# Patient Record
Sex: Male | Born: 1995 | Race: White | Hispanic: No | Marital: Single | Smoking: Current every day smoker
Health system: Southern US, Community
[De-identification: ages and names within clinical notes are randomized; demographics above are authoritative.]

## PROBLEM LIST (undated history)

## (undated) DIAGNOSIS — F909 Attention-deficit hyperactivity disorder, unspecified type: Secondary | ICD-10-CM

## (undated) HISTORY — PX: TONSILLECTOMY: SUR1361

---
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
## 2016-12-30 ENCOUNTER — Encounter: Payer: Self-pay | Admitting: Emergency Medicine

## 2016-12-30 ENCOUNTER — Emergency Department
Admission: EM | Admit: 2016-12-30 | Discharge: 2016-12-30 | Disposition: A | Discharge status: Home / Self Care | Payer: Medicaid Other | Attending: Emergency Medicine | Admitting: Emergency Medicine

## 2016-12-30 DIAGNOSIS — R079 Chest pain, unspecified: Secondary | ICD-10-CM | POA: Diagnosis not present

## 2016-12-30 DIAGNOSIS — R42 Dizziness and giddiness: Secondary | ICD-10-CM | POA: Diagnosis present

## 2016-12-30 DIAGNOSIS — Z5321 Procedure and treatment not carried out due to patient leaving prior to being seen by health care provider: Secondary | ICD-10-CM | POA: Insufficient documentation

## 2016-12-30 HISTORY — DX: Attention-deficit hyperactivity disorder, unspecified type: F90.9

## 2016-12-30 LAB — BASIC METABOLIC PANEL
Anion gap: 8 (ref 5–15)
BUN: 9 mg/dL (ref 6–20)
CO2: 29 mmol/L (ref 22–32)
CREATININE: 0.71 mg/dL (ref 0.61–1.24)
Calcium: 9.3 mg/dL (ref 8.9–10.3)
Chloride: 103 mmol/L (ref 101–111)
GFR calc Af Amer: >60 mL/min (ref >60)
GLUCOSE: 118 mg/dL — AB (ref 65–99)
POTASSIUM: 3.7 mmol/L (ref 3.5–5.1)
SODIUM: 140 mmol/L (ref 135–145)

## 2016-12-30 LAB — URINALYSIS, COMPLETE (UACMP) WITH MICROSCOPIC
BACTERIA UA: NONE SEEN
Bilirubin Urine: NEGATIVE
GLUCOSE, UA: NEGATIVE mg/dL
Hgb urine dipstick: NEGATIVE
KETONES UR: NEGATIVE mg/dL
LEUKOCYTES UA: NEGATIVE
NITRITE: NEGATIVE
PROTEIN: NEGATIVE mg/dL
SQUAMOUS EPITHELIAL / LPF: NONE SEEN
Specific Gravity, Urine: 1.002 — ABNORMAL LOW (ref 1.005–1.030)
pH: 7 (ref 5.0–8.0)

## 2016-12-30 LAB — CBC
HEMATOCRIT: 44.4 % (ref 40.0–52.0)
Hemoglobin: 15.7 g/dL (ref 13.0–18.0)
MCH: 31.3 pg (ref 26.0–34.0)
MCHC: 35.3 g/dL (ref 32.0–36.0)
MCV: 88.6 fL (ref 80.0–100.0)
PLATELETS: 239 10*3/uL (ref 150–440)
RBC: 5.01 MIL/uL (ref 4.40–5.90)
RDW: 13.4 % (ref 11.5–14.5)
WBC: 8.3 10*3/uL (ref 3.8–10.6)

## 2016-12-30 LAB — TROPONIN I: Troponin I: <0.03 ng/mL (ref <0.03)

## 2016-12-30 NOTE — ED Notes (Signed)
Pt called in WR x2, with no answer.

## 2016-12-30 NOTE — ED Triage Notes (Addendum)
Patient coming in for dizziness and just wants to get checked out. Patient states he had chest pain for a little bit right after the dizziness. Denies nausea or vomitting and a headache. Patient does states that he has been urinating a lot but denies dysuria. But asks nurse in triage if he could be dizzy since he hasn't slept all tonight.

## 2016-12-30 NOTE — ED Notes (Signed)
Pt called in WR x1 with no answer.

## 2016-12-30 NOTE — ED Notes (Signed)
Pt called x3 in WR, with no answer.

## 2018-08-15 DIAGNOSIS — L02411 Cutaneous abscess of right axilla: Secondary | ICD-10-CM | POA: Diagnosis not present

## 2018-08-15 DIAGNOSIS — R600 Localized edema: Secondary | ICD-10-CM | POA: Diagnosis not present

## 2018-08-15 DIAGNOSIS — L539 Erythematous condition, unspecified: Secondary | ICD-10-CM | POA: Diagnosis not present

## 2018-08-15 DIAGNOSIS — R234 Changes in skin texture: Secondary | ICD-10-CM | POA: Diagnosis not present

## 2019-03-13 DIAGNOSIS — S91331A Puncture wound without foreign body, right foot, initial encounter: Secondary | ICD-10-CM | POA: Diagnosis not present

## 2019-03-13 DIAGNOSIS — Z23 Encounter for immunization: Secondary | ICD-10-CM | POA: Diagnosis not present

## 2019-10-18 NOTE — Progress Notes (Deleted)
Patient is a 24 year old male who presents today new to the practice  His last medical care was received in the emergency room on 08/15/2018 for an abscess in his axilla.  He is known to have a history of ADHD   Tob -current every day smoker, also uses marijuana Alcohol-denies current use

## 2019-10-19 ENCOUNTER — Ambulatory Visit: Payer: Self-pay | Admitting: Internal Medicine

## 2021-12-09 ENCOUNTER — Ambulatory Visit: Payer: Medicaid Other | Admitting: Nurse Practitioner

## 2021-12-17 ENCOUNTER — Ambulatory Visit: Payer: Medicaid Other | Admitting: Nurse Practitioner

## 2023-08-11 DIAGNOSIS — Z1329 Encounter for screening for other suspected endocrine disorder: Secondary | ICD-10-CM | POA: Diagnosis not present

## 2023-08-11 DIAGNOSIS — Z131 Encounter for screening for diabetes mellitus: Secondary | ICD-10-CM | POA: Diagnosis not present

## 2023-08-11 DIAGNOSIS — Z1159 Encounter for screening for other viral diseases: Secondary | ICD-10-CM | POA: Diagnosis not present

## 2023-08-11 DIAGNOSIS — Z720 Tobacco use: Secondary | ICD-10-CM | POA: Diagnosis not present

## 2023-08-11 DIAGNOSIS — Z130 Encounter for screening for diseases of the blood and blood-forming organs and certain disorders involving the immune mechanism: Secondary | ICD-10-CM | POA: Diagnosis not present

## 2023-08-11 DIAGNOSIS — Z13220 Encounter for screening for lipoid disorders: Secondary | ICD-10-CM | POA: Diagnosis not present

## 2023-08-16 DIAGNOSIS — J209 Acute bronchitis, unspecified: Secondary | ICD-10-CM | POA: Diagnosis not present

## 2023-08-16 DIAGNOSIS — J301 Allergic rhinitis due to pollen: Secondary | ICD-10-CM | POA: Diagnosis not present

## 2023-08-16 DIAGNOSIS — R059 Cough, unspecified: Secondary | ICD-10-CM | POA: Diagnosis not present

## 2023-08-16 DIAGNOSIS — R062 Wheezing: Secondary | ICD-10-CM | POA: Diagnosis not present

## 2023-08-20 DIAGNOSIS — F17203 Nicotine dependence unspecified, with withdrawal: Secondary | ICD-10-CM | POA: Diagnosis not present

## 2023-08-24 DIAGNOSIS — R0602 Shortness of breath: Secondary | ICD-10-CM | POA: Diagnosis not present

## 2023-08-26 ENCOUNTER — Emergency Department
Admission: EM | Admit: 2023-08-26 | Discharge: 2023-08-26 | Disposition: A | Discharge status: Home / Self Care | Attending: Emergency Medicine | Admitting: Emergency Medicine

## 2023-08-26 ENCOUNTER — Other Ambulatory Visit: Payer: Self-pay

## 2023-08-26 DIAGNOSIS — F17210 Nicotine dependence, cigarettes, uncomplicated: Secondary | ICD-10-CM | POA: Insufficient documentation

## 2023-08-26 DIAGNOSIS — F17200 Nicotine dependence, unspecified, uncomplicated: Secondary | ICD-10-CM

## 2023-08-26 DIAGNOSIS — F32A Depression, unspecified: Secondary | ICD-10-CM | POA: Diagnosis not present

## 2023-08-26 NOTE — ED Triage Notes (Addendum)
 Pt to ed from home via POV for "withdrawal from cigarettes and vaping" Pt then states I dont feel good I am depressed but denies SI in triage. Pt has seen a doc for same in the past but couldn't see her today bc she "was off today". Pt is caox4, in no acute distress and ambulatory in triage. Pt does have nicotine gum but not using it as ordered.

## 2023-08-26 NOTE — ED Provider Notes (Signed)
 [facility] Provider Note    Event Date/Time   First MD Initiated Contact with Patient 08/26/23 1718     (approximate)   History   Withdrawal (From tobacco)   HPI Nathan Baker is a 28 y.o. male presenting today for tobacco dependence.  Patient states he has been smoking cigarettes and vaping for a long time.  He has been trying to quit and feels like he is having some withdrawal symptoms.  He has tried nicotine gum in the past but states that he does not want to use this because he does not want to still be addicted to nicotine and would like to completely quit.  He also states is causing him some depression.  Denies SI or HI.     Physical Exam   Triage Vital Signs: ED Triage Vitals [08/26/23 1626]  Encounter Vitals Group     BP (!) 143/93     Systolic BP Percentile      Diastolic BP Percentile      Pulse Rate 62     Resp 18     Temp 98 F (36.7 C)     Temp Source Oral     SpO2 98 %     Weight      Height 5\' 7"  (1.702 m)     Head Circumference      Peak Flow      Pain Score 0     Pain Loc      Pain Education      Exclude from Growth Chart     Most recent vital signs: Vitals:   08/26/23 1626  BP: (!) 143/93  Pulse: 62  Resp: 18  Temp: 98 F (36.7 C)  SpO2: 98%   I have reviewed the vital signs. General:  Awake, alert, no acute distress. Head:  Normocephalic, Atraumatic. EENT:  PERRL, EOMI, Oral mucosa pink and moist, Neck is supple. Cardiovascular: Regular rate, 2+ distal pulses. Respiratory:  Normal respiratory effort, symmetrical expansion, no distress.   Extremities:  Moving all four extremities through full ROM without pain.   Neuro:  Alert and oriented.  Interacting appropriately.   Skin:  Warm, dry, no rash.   Psych: Appropriate affect.    ED Results / Procedures / Treatments   Labs (all labs ordered are listed, but only abnormal results are displayed) Labs Reviewed - No data to  display   EKG    RADIOLOGY    PROCEDURES:  Critical Care performed: No  Procedures   MEDICATIONS ORDERED IN ED: Medications - No data to display   IMPRESSION / MDM / ASSESSMENT AND PLAN / ED COURSE  I reviewed the triage vital signs and the nursing notes.                              Differential diagnosis includes, but is not limited to, nicotine withdrawal, depression  Patient's presentation is most consistent with acute, uncomplicated illness.  Patient is a 28 year old male presenting today for nicotine withdrawal and depression.  He does not wish to start anything like nicotine patches or gum and would like to just completely quit smoking.  Did not want any other additional medication.  Also noted this is because some depression.  I offered to have our psychiatry team come talk with him here versus have him follow-up with them outpatient.  He states he would prefer to see them outpatient would like a referral.  I have placed referral as well as given him information in his discharge paperwork to set up a follow-up appointment.  Denies SI or HI.  Safer discharge.     FINAL CLINICAL IMPRESSION(S) / ED DIAGNOSES   Final diagnoses:  Tobacco dependence  Depression, unspecified depression type     Rx / DC Orders   ED Discharge Orders          Ordered    Ambulatory referral to Psychiatry        08/26/23 1733             Note:  This document was prepared using Dragon voice recognition software and may include unintentional dictation errors.   Janith Lima, MD 08/26/23 [phone]

## 2023-08-26 NOTE — Discharge Instructions (Signed)
 Please call the psychiatry clinic I have put in your discharge paperwork for follow-up.  They can help set up an outpatient appointment.  Please return for any worsening symptoms.

## 2023-08-29 DIAGNOSIS — Z720 Tobacco use: Secondary | ICD-10-CM | POA: Diagnosis not present

## 2023-08-29 DIAGNOSIS — R053 Chronic cough: Secondary | ICD-10-CM | POA: Diagnosis not present

## 2023-08-29 DIAGNOSIS — F32A Depression, unspecified: Secondary | ICD-10-CM | POA: Diagnosis not present

## 2023-08-29 DIAGNOSIS — F39 Unspecified mood [affective] disorder: Secondary | ICD-10-CM | POA: Diagnosis not present

## 2023-08-29 DIAGNOSIS — B353 Tinea pedis: Secondary | ICD-10-CM | POA: Diagnosis not present

## 2023-09-01 DIAGNOSIS — F321 Major depressive disorder, single episode, moderate: Secondary | ICD-10-CM | POA: Diagnosis not present

## 2023-09-26 DIAGNOSIS — Z1159 Encounter for screening for other viral diseases: Secondary | ICD-10-CM | POA: Diagnosis not present
